# Patient Record
Sex: Male | Born: 2000 | Race: White | Hispanic: No | Marital: Single | Smoking: Never smoker
Health system: Southern US, Community
[De-identification: ages and names within clinical notes are randomized; demographics above are authoritative.]

## PROBLEM LIST (undated history)

## (undated) DIAGNOSIS — F419 Anxiety disorder, unspecified: Secondary | ICD-10-CM

## (undated) DIAGNOSIS — F909 Attention-deficit hyperactivity disorder, unspecified type: Secondary | ICD-10-CM

---
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
## 2020-11-02 ENCOUNTER — Emergency Department (HOSPITAL_BASED_OUTPATIENT_CLINIC_OR_DEPARTMENT_OTHER)
Admission: EM | Admit: 2020-11-02 | Discharge: 2020-11-02 | Disposition: A | Discharge status: Home / Self Care | Payer: BC Managed Care – PPO | Attending: Emergency Medicine | Admitting: Emergency Medicine

## 2020-11-02 ENCOUNTER — Other Ambulatory Visit: Payer: Self-pay

## 2020-11-02 ENCOUNTER — Encounter (HOSPITAL_BASED_OUTPATIENT_CLINIC_OR_DEPARTMENT_OTHER): Payer: Self-pay | Admitting: Emergency Medicine

## 2020-11-02 DIAGNOSIS — Y92410 Unspecified street and highway as the place of occurrence of the external cause: Secondary | ICD-10-CM | POA: Diagnosis not present

## 2020-11-02 DIAGNOSIS — Z5321 Procedure and treatment not carried out due to patient leaving prior to being seen by health care provider: Secondary | ICD-10-CM | POA: Insufficient documentation

## 2020-11-02 DIAGNOSIS — S50812A Abrasion of left forearm, initial encounter: Secondary | ICD-10-CM | POA: Diagnosis present

## 2020-11-02 HISTORY — DX: Anxiety disorder, unspecified: F41.9
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
## 2023-08-16 ENCOUNTER — Ambulatory Visit
Admission: EM | Admit: 2023-08-16 | Discharge: 2023-08-16 | Disposition: A | Discharge status: Home / Self Care | Payer: Worker's Compensation | Attending: Internal Medicine | Admitting: Internal Medicine

## 2023-08-16 DIAGNOSIS — S61313A Laceration without foreign body of left middle finger with damage to nail, initial encounter: Secondary | ICD-10-CM

## 2023-08-16 NOTE — ED Provider Notes (Signed)
[facility]  Note:  This document was prepared using Dragon voice recognition software and may include unintentional dictation errors.  MRN: [id number] DOB: 24-Nov-2000 DATE: 08/16/23   Subjective:  Chief Complaint:  Chief Complaint  Patient presents with   Laceration    HPI: Louis Ross is a 22 y.o. male presenting for laceration to his left third finger 15 minutes ago. 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 He reports prior laceration to the same finger earlier this year. He states his tetanus was updated earlier this year as well. Patient is right hand dominate. Minimal bleeding control with pressure. Denies fever, nausea/vomiting, numbness/tingling. Endorses laceration. Presents NAD.  Prior to Admission medications   Medication Sig Start Date End Date Taking? Authorizing Provider  dexmethylphenidate (FOCALIN XR) 15 MG 24 hr capsule Take 15 mg by mouth daily.    [provider]  dexmethylphenidate (FOCALIN) 5 MG tablet Take 5 mg by mouth 2 (two) times daily.    [provider]  HYDROcodone-acetaminophen (LORTAB) 5-325 MG per tablet Take 1 tablet by mouth every 4 (four) hours as needed for pain. 07/29/13   Ree Shay, MD     No Known Allergies  History:   Past Medical History:  Diagnosis Date   ADHD (attention deficit hyperactivity disorder)    Anxiety      History reviewed. No pertinent surgical history.  History reviewed. No pertinent family history.  Social History   Tobacco Use   Smoking status: Never   Smokeless tobacco: Never  Vaping Use   Vaping status: Never Used  Substance Use Topics   Alcohol use: Not Currently   Drug use: Never    Review of Systems  Constitutional:  Negative for fever.  Gastrointestinal:  Negative for nausea and vomiting.  Skin:  Positive for wound.  Neurological:  Negative for numbness.     Objective:   Vitals: BP (!) 143/84 (BP Location: Right Arm)   Pulse 89    Temp 98.2 F (36.8 C) (Oral)   Resp 16   SpO2 97%   Physical Exam Constitutional:      General: He is not in acute distress.    Appearance: Normal appearance. He is well-developed and normal weight. He is not ill-appearing or toxic-appearing.  HENT:     Head: Normocephalic and atraumatic.  Cardiovascular:     Rate and Rhythm: Normal rate and regular rhythm.     Pulses:          Radial pulses are 2+ on the left side.     Heart sounds: Normal heart sounds.  Pulmonary:     Effort: Pulmonary effort is normal.     Breath sounds: Normal breath sounds.     Comments: Clear to auscultation bilaterally  Abdominal:     General: Bowel sounds are normal.     Palpations: Abdomen is soft.     Tenderness: There is no abdominal tenderness.  Skin:    General: Skin is warm and dry.     Findings: Laceration present.     Comments: Patient has approximately 0.5 cm laceration to DIP of left third digit.  Approximately one third of the laceration extends into the medial nail bed.  Minimal bleeding noted at time of exam.  Tenderness to palpation.  Neurovascular intact.  Full range of motion.  No warmth, erythema, discharge.  Neurological:     General: No focal deficit present.     Mental Status: He  is alert.  Psychiatric:        Mood and Affect: Mood and affect normal.     Results:  Labs: No results found for this or any previous visit (from the past 24 hour(s)).  Radiology: No results found.   UC Course/Treatments:  Procedures: Laceration Repair  Date/Time: 08/16/2023 7:46 PM  Performed by: Cynda Acres, PA-C Authorized by: Cynda Acres, PA-C   Consent:    Consent obtained:  Verbal   Consent given by:  Patient   Risks, benefits, and alternatives were discussed: yes     Risks discussed:  Infection and pain   Alternatives discussed: sutures versus dermabond versus steri strips. Universal protocol:    Procedure explained and questions answered to patient or proxy's  satisfaction: yes     Patient identity confirmed:  Verbally with patient Anesthesia:    Anesthesia method:  None Laceration details:    Location:  Finger   Finger location:  L long finger   Length (cm):  0.5 Exploration:    Hemostasis achieved with:  Direct pressure   Imaging outcome: foreign body not noted     Wound exploration: wound explored through full range of motion     Contaminated: no   Treatment:    Area cleansed with:  Povidone-iodine and saline   Amount of cleaning:  Standard Skin repair:    Repair method:  Steri-Strips   Number of Steri-Strips:  3 Repair type:    Repair type:  Simple Post-procedure details:    Dressing:  Sterile dressing   Procedure completion:  Tolerated well, no immediate complications    Medications Ordered in UC: Medications - No data to display   Assessment and Plan :     ICD-10-CM   1. Laceration of left middle finger without foreign body without damage to nail, initial encounter  S61.213A      Laceration of left middle finger without foreign body without damage to nail, initial encounter Afebrile, nontoxic-appearing, NAD. VSS. Informed patient that he needed sutures to the left 3rd DIP. Patient became very anxious about the procedure. I answered all his questions to the best of my ability in regards to the procedure and alternative options. Patient agreed to procedure despite hesitations. However, when I arrived to the room with materials, patient had near syncopal episode after just seeing the tray. I once again talked through the procedure and answered his questions. At that point, patient decided he could not tolerate the procedure and decided to have steri strips and dressing placed. I advised him of the increased risk of infection as well as poor heeling. Patient agreed to steri strips despite risks involved. Area was cleansed and 3 steri strips placed with dressing. Wound care instructions provided. Recommend follow up with occupational  health at [facility] for completion of paperwork. Recommend OTC analgesics as needed for pain. Strict ED precautions were given and patient verbalized understanding.  ED Discharge Orders     None        I have reviewed the PDMP during this encounter.     Cynda Acres, PA-C 08/16/23 1952

## 2023-08-16 NOTE — ED Triage Notes (Signed)
Pt with left middle finger laceration today. No loss of feeling or ability to move finger.

## 2023-08-16 NOTE — Discharge Instructions (Signed)
Keep your wound clean and as dry as possible. Do not immerse or soak your wound in water for the next 24. This means no swimming, washing dishes (unless thick rubber gloves are used), baths, or hot tubs until the stitches are removed. Leave original bandages on for the first 24 hours. After this time, showering or rinsing is recommended, rather than bathing. After the first 24 hours, remove old bandages. You can wash your hands like normal at this time. I recommend you continue covering the wound with a bandage.    It is very important for you to pay attention to any new symptoms or worsening of your current condition. Please go directly to the Emergency Department immediately should you begin to feel worse in any way or have any of the following symptoms: persistent fevers, increased pain, increased swelling, increased redness, redness outside the demarcation or streaking up your hand.

## 2024-05-30 ENCOUNTER — Other Ambulatory Visit: Payer: Self-pay

## 2024-05-30 ENCOUNTER — Ambulatory Visit
Admission: EM | Admit: 2024-05-30 | Discharge: 2024-05-30 | Disposition: A | Discharge status: Home / Self Care | Payer: Worker's Compensation | Attending: Internal Medicine | Admitting: Internal Medicine

## 2024-05-30 DIAGNOSIS — S61012A Laceration without foreign body of left thumb without damage to nail, initial encounter: Secondary | ICD-10-CM | POA: Diagnosis not present

## 2024-05-30 DIAGNOSIS — Y990 Civilian activity done for income or pay: Secondary | ICD-10-CM | POA: Diagnosis not present

## 2024-05-30 MED ORDER — DOXYCYCLINE HYCLATE 100 MG PO CAPS: 100.0000 mg | ORAL_CAPSULE | Freq: Two times a day (BID) | ORAL | 0 refills | Status: AC

## 2024-05-30 NOTE — ED Triage Notes (Signed)
 Laceration to left thumb on a knife at work. Patient works at [employer].

## 2024-05-30 NOTE — ED Provider Notes (Signed)
 [facility]  Note:  This document was prepared using Dragon voice recognition software and may include unintentional dictation errors.  MRN: [id number] DOB: [birth year] DATE: 05/30/24   Subjective:  Chief Complaint:  Chief Complaint  Patient presents with   Laceration     HPI: Louis Ross is a 23 y.o. male presenting for laceration to his left thumb less than one hour ago. Patient states he was at work at [employer] bar working the [employer] when he cut his finger. He states he was chopping parsley when the knife slipped and cut his finger. He states he immediately grabbed a dirty towel and then his co-workers gave him a new towel. He reports last tetanus was a year ago when he had another laceration to the same hand. Denies fever, nausea/vomiting, numbness/tingling. Endorses laceration, bleeding. Presents NAD.  Prior to Admission medications   Medication Sig Start Date End Date Taking? Authorizing Provider  doxycycline  (VIBRAMYCIN ) 100 MG capsule Take 1 capsule (100 mg total) by mouth 2 (two) times daily for 7 days. 05/30/24 06/06/24 Yes Malani Lees P, PA-C     No Known Allergies  History:   Past Medical History:  Diagnosis Date   ADHD (attention deficit hyperactivity disorder)    Anxiety      History reviewed. No pertinent surgical history.  History reviewed. No pertinent family history.  Social History   Tobacco Use   Smoking status: Never   Smokeless tobacco: Never  Vaping Use   Vaping status: Never Used  Substance Use Topics   Alcohol use: Yes    Comment: occasional   Drug use: Never    Review of Systems  Constitutional:  Negative for fever.  Gastrointestinal:  Negative for nausea and vomiting.  Musculoskeletal:  Positive for arthralgias.  Skin:  Positive for wound.  Neurological:  Negative for numbness.  Psychiatric/Behavioral:  The patient is nervous/anxious.      Objective:   Vitals: BP (!) 137/96 (BP Location: Right Arm)   Pulse (!)  105   Temp 98.3 F (36.8 C) (Oral)   Resp 16   SpO2 98%   Physical Exam Constitutional:      General: He is not in acute distress.    Appearance: Normal appearance. He is well-developed. He is not ill-appearing or toxic-appearing.  HENT:     Head: Normocephalic and atraumatic.  Cardiovascular:     Rate and Rhythm: Normal rate and regular rhythm.     Pulses:          Radial pulses are 2+ on the left side.     Heart sounds: Normal heart sounds.  Pulmonary:     Effort: Pulmonary effort is normal.     Breath sounds: Normal breath sounds.     Comments: Clear to auscultation bilaterally  Abdominal:     General: Bowel sounds are normal.     Palpations: Abdomen is soft.     Tenderness: There is no abdominal tenderness.  Skin:    General: Skin is warm and dry.     Findings: Laceration and wound present.     Comments: Patient has laceration to left thumb palmar surface approximately 3cm in size. Minimal bleeding noted on exam. Full ROM. NV intact. No warmth, erythema, or discharge.   Neurological:     General: No focal deficit present.     Mental Status: He is alert.  Psychiatric:        Mood and Affect: Mood and affect normal.     Results:  Labs: No results found for this or any previous visit (from the past 24 hours).  Radiology: No results found.   UC Course/Treatments:  Procedures: Laceration Repair  Date/Time: 05/30/2024 8:28 PM  Performed by: Basilia Ulanda SQUIBB, PA-C Authorized by: Basilia Ulanda SQUIBB, PA-C   Consent:    Consent obtained:  Verbal   Consent given by:  Patient   Risks, benefits, and alternatives were discussed: yes     Risks discussed:  Pain and infection   Alternatives discussed:  Referral Universal protocol:    Patient identity confirmed:  Verbally with patient Anesthesia:    Anesthesia method:  Nerve block   Block needle gauge:  27 G   Block anesthetic:  Lidocaine 2% w/o epi   Block injection procedure:  Anatomic landmarks identified,  anatomic landmarks palpated and introduced needle   Block outcome:  Anesthesia achieved Laceration details:    Location:  Finger   Finger location:  L thumb Pre-procedure details:    Preparation:  Patient was prepped and draped in usual sterile fashion Exploration:    Imaging outcome: foreign body not noted     Wound exploration: wound explored through full range of motion   Treatment:    Area cleansed with:  Saline and povidone-iodine   Amount of cleaning:  Standard   Irrigation solution:  Sterile saline   Irrigation method:  Syringe Skin repair:    Repair method:  Sutures   Suture size:  4-0   Suture material:  Nylon   Suture technique:  Simple interrupted   Number of sutures:  6 Approximation:    Approximation:  Close Repair type:    Repair type:  Simple Post-procedure details:    Dressing:  Non-adherent dressing   Procedure completion:  Tolerated well, no immediate complications    Medications Ordered in UC: Medications - No data to display   Assessment and Plan :     ICD-10-CM   1. Laceration of left thumb without damage to nail, initial encounter  S61.012A     2. Work related injury  Y99.0      Laceration of left thumb without damage to nail, initial encounter Work related injury Afebrile, nontoxic-appearing, NAD. VSS. DDX includes but not limited to: laceration, abrasion, abscess Laceration to left thumb. Area cleansed. Digital block achieved with lidocaine 1% without epi. 6 sutures placed. Dressing applied. Wound care instructions provided. Given laceration was to his thumb, Doxycycline  100mg  BID was prescribed prophylactically. Strict ED precautions were given and patient verbalized understanding.   ED Discharge Orders          Ordered    doxycycline  (VIBRAMYCIN ) 100 MG capsule  2 times daily        05/30/24 2021             PDMP not reviewed this encounter.     Basilia Ulanda SQUIBB, PA-C 05/30/24 2032

## 2024-05-30 NOTE — ED Notes (Signed)
 Bulky dressing with telfa, 4x4 and kling applied. Wound care teaching provided to patient.

## 2024-05-30 NOTE — Discharge Instructions (Signed)
 Keep your wound clean and as dry as possible. Do not immerse or soak your wound in water. This means no swimming, washing dishes (unless thick rubber gloves are used), baths, or hot tubs until the stitches are removed. Leave original bandages on for the first 24 hours. After this time, showering or rinsing is recommended, rather than bathing. After the first 24 hours, remove old bandages and gently cleanse your wound with soap and water. Cleansing twice a day prevents buildup of debris and will result in easier suture removal. Return in 14 days for suture removal.

## 2024-06-10 ENCOUNTER — Ambulatory Visit
Admission: EM | Admit: 2024-06-10 | Discharge: 2024-06-10 | Disposition: A | Discharge status: Home / Self Care | Payer: Self-pay

## 2024-06-10 ENCOUNTER — Other Ambulatory Visit: Payer: Self-pay

## 2024-06-10 NOTE — ED Triage Notes (Signed)
 Patient presents for suture removal left thumb. Patient states six sutures that have been inn place for 11 days.

## 2024-06-10 NOTE — ED Notes (Signed)
 Six sutures removed from left thumb. Patient tolerated fair, C/O pain with suture removal. Wound checked by Ashlee Hermanns PAC and steri strips applied.

## 2024-09-19 ENCOUNTER — Ambulatory Visit
Admission: EM | Admit: 2024-09-19 | Discharge: 2024-09-19 | Disposition: A | Discharge status: Home / Self Care | Attending: Internal Medicine | Admitting: Internal Medicine

## 2024-09-19 ENCOUNTER — Other Ambulatory Visit: Payer: Self-pay

## 2024-09-19 DIAGNOSIS — M545 Low back pain, unspecified: Secondary | ICD-10-CM | POA: Insufficient documentation

## 2024-09-19 DIAGNOSIS — R3121 Asymptomatic microscopic hematuria: Secondary | ICD-10-CM | POA: Diagnosis present

## 2024-09-19 LAB — POCT URINE DIPSTICK
Bilirubin, UA: NEGATIVE
Glucose, UA: NEGATIVE mg/dL
Ketones, POC UA: NEGATIVE mg/dL
Leukocytes, UA: NEGATIVE
Nitrite, UA: NEGATIVE
Protein Ur, POC: NEGATIVE mg/dL
Spec Grav, UA: 1.015 (ref 1.010–1.025)
Urobilinogen, UA: 0.2 U/dL
pH, UA: 7 (ref 5.0–8.0)

## 2024-09-19 MED ORDER — BACLOFEN 5 MG PO TABS: 5.0000 mg | ORAL_TABLET | Freq: Three times a day (TID) | ORAL | 0 refills | Status: AC | PRN

## 2024-09-19 MED ORDER — METHYLPREDNISOLONE 4 MG PO TBPK: ORAL_TABLET | ORAL | 0 refills | Status: AC

## 2024-09-19 NOTE — ED Triage Notes (Signed)
 Patient C/O lower left back pain for 4 to 5 days. Patient states no known injury. Patient states he has taken tylenol  for pain. Denies numbness or tingling to extremities.

## 2024-09-19 NOTE — ED Provider Notes (Signed)
 [facility]  Note:  This document was prepared using Dragon voice recognition software and may include unintentional dictation errors.  MRN: [id number] DOB: [birth year] DATE: 09/19/24   Subjective:  Chief Complaint:  Chief Complaint  Patient presents with   Back Pain    HPI: Louis Ross is a 23 y.o. male presenting for left sided low back pain for the past 4-5 days. Patient states that he was purposely popping his back 4-5 days ago when he started having left lower back pain. He states he felt a pop at that time, but did not do anything different than he normally does when he has popped his back in the past. Reports pain is left sided towards his spine. He reports taking tylenol  the first day with no relief. He has not taken anything for his pain recently. He states pain is worse with flexion and walking. He reports that the pain would improve throughout the day, but today it has not improved and the pain has been constant. Reports no radiation down his leg, but feels he may have had some radiation into his abdomen with prolonged standing. Denies fever, nausea/vomiting, dysuria, hematuria, numbness/tingling, urinary/bowel incontinence, saddle paresthesia. Endorses left-sided low back pain. Presents NAD.  Prior to Admission medications   Not on File     No Known Allergies  History:   Past Medical History:  Diagnosis Date   ADHD (attention deficit hyperactivity disorder)    Anxiety      History reviewed. No pertinent surgical history.  History reviewed. No pertinent family history.  Social History   Tobacco Use   Smoking status: Never   Smokeless tobacco: Never  Vaping Use   Vaping status: Never Used  Substance Use Topics   Alcohol use: Yes    Comment: occasional   Drug use: Never    Review of Systems  Constitutional:  Negative for fever.  Gastrointestinal:  Positive for abdominal pain. Negative for nausea and vomiting.  Genitourinary:  Negative for dysuria,  flank pain and hematuria.  Musculoskeletal:  Positive for back pain and myalgias.  Skin:  Negative for rash.  Neurological:  Negative for numbness.     Objective:   Vitals: BP 112/70 (BP Location: Right Arm)   Pulse 69   Temp 98.3 F (36.8 C) (Oral)   Resp 16   SpO2 97%   Physical Exam Constitutional:      General: He is not in acute distress.    Appearance: Normal appearance. He is well-developed and normal weight. He is not ill-appearing or toxic-appearing.  HENT:     Head: Normocephalic and atraumatic.  Cardiovascular:     Rate and Rhythm: Normal rate and regular rhythm.     Heart sounds: Normal heart sounds.  Pulmonary:     Effort: Pulmonary effort is normal.     Breath sounds: Normal breath sounds.     Comments: Clear to auscultation bilaterally  Abdominal:     General: Bowel sounds are normal.     Palpations: Abdomen is soft.     Tenderness: There is no abdominal tenderness. There is no right CVA tenderness or left CVA tenderness.     Comments: No acute abdomen   Musculoskeletal:     Lumbar back: Tenderness present. Decreased range of motion.       Back:     Comments: TTP along left lower back. Decreased ROM due to pain with flexion and walking/weightbearing. No warmth, erythema, or discharge.   Skin:    General: Skin  is warm and dry.  Neurological:     General: No focal deficit present.     Mental Status: He is alert.  Psychiatric:        Mood and Affect: Mood and affect normal.     Results:  Labs: Results for orders placed or performed during the hospital encounter of 09/19/24 (from the past 24 hours)  POCT URINE DIPSTICK     Status: Abnormal   Collection Time: 09/19/24  3:22 PM  Result Value Ref Range   Color, UA yellow yellow   Clarity, UA clear clear   Glucose, UA negative negative mg/dL   Bilirubin, UA negative negative   Ketones, POC UA negative negative mg/dL   Spec Grav, UA 8.984 8.989 - 1.025   Blood, UA trace-intact (A) negative   pH,  UA 7.0 5.0 - 8.0   Protein Ur, POC negative negative mg/dL   Urobilinogen, UA 0.2 0.2 or 1.0 E.U./dL   Nitrite, UA Negative Negative   Leukocytes, UA Negative Negative    Radiology: No results found.   UC Course/Treatments:  Procedures: Procedures   Medications Ordered in UC: Medications - No data to display   Assessment and Plan :     ICD-10-CM   1. Acute left-sided low back pain without sciatica  M54.50      Acute left-sided low back pain without sciatica Afebrile, nontoxic-appearing, NAD. VSS. DDX includes but not limited to: fracture, contusion, dislocation, strain, cystitis, pyelonephritis, nephrolithiasis UA was positive for trace blood. Urine culture is pending. Unable to perform imaging today in office due to lack of RT. Low suspicion for pyelonephritis given no dysuria or CVA tenderness. Low suspicion for nephrolithiasis given on CVA tenderness and only trace blood. Suspect strain. Patient declined toradol injection today in office. Baclofen  5mg  TID PRN was prescribed for muscle spasms and pain as well as a Medrol  DosePak as directed. If no improvement, recommend follow up for imaging. Recommend rest and he can alternate ice/heat. Strict ED precautions were given and patient verbalized understanding.  ED Discharge Orders          Ordered    Baclofen  5 MG TABS  3 times daily PRN        09/19/24 1513    methylPREDNISolone  (MEDROL  DOSEPAK) 4 MG TBPK tablet        09/19/24 1529             PDMP not reviewed this encounter.     Basilia Ulanda SQUIBB, PA-C 09/19/24 1534

## 2024-09-19 NOTE — Discharge Instructions (Signed)
 You were given a prescription for a muscle relaxer (Baclofen) take at bedtime when you are not driving or working because it may cause dizziness/drowsiness.  You were given a prescription for a steroid taper. Take as directed. You should avoid taking NSAIDs (ibuprofen, aleve, advil, ect.) with it due to risk of GI upset. It is okay to take tylenol  with it.   Recommend rest and ice.   Your urine was sent to the lab for further testing. Someone from our office will call you with your results.  If no improvement, recommend follow up for imaging.   Return in 3-4 days if no improvement. Your evaluation was not suggestive of any emergent condition requiring medical intervention at this time. However, our office is limited in the tests and imaging we can perform at our facility.  Therefore, it is very important for you to pay attention to any new symptoms or worsening of your current condition.   Please go directly to the Emergency Department immediately should you begin to feel worse in any way or have any of the following symptoms: bowel/urinary incontinence, numbness between your legs, fever, new onset numbness/tingling, increased pain, blood in urine, abdominal pain, or persistent nausea/vomiting.

## 2024-09-21 LAB — URINE CULTURE: Culture: NO GROWTH
